# Patient Record
Sex: Male | Born: 1980 | Race: White | Hispanic: No | Marital: Single | Smoking: Never smoker
Health system: Southern US, Community
[De-identification: ages and names within clinical notes are randomized; demographics above are authoritative.]

## PROBLEM LIST (undated history)

## (undated) DIAGNOSIS — K219 Gastro-esophageal reflux disease without esophagitis: Secondary | ICD-10-CM

---

## 1999-03-06 ENCOUNTER — Emergency Department (HOSPITAL_COMMUNITY): Admission: EM | Admit: 1999-03-06 | Discharge: 1999-03-06 | Payer: Self-pay

## 2018-05-29 ENCOUNTER — Other Ambulatory Visit: Payer: Self-pay

## 2018-05-29 ENCOUNTER — Emergency Department (HOSPITAL_COMMUNITY)
Admission: EM | Admit: 2018-05-29 | Discharge: 2018-05-29 | Disposition: A | Discharge status: Home / Self Care | Payer: PRIVATE HEALTH INSURANCE | Attending: Emergency Medicine | Admitting: Emergency Medicine

## 2018-05-29 ENCOUNTER — Encounter (HOSPITAL_COMMUNITY): Payer: Self-pay | Admitting: *Deleted

## 2018-05-29 ENCOUNTER — Emergency Department (HOSPITAL_COMMUNITY): Payer: PRIVATE HEALTH INSURANCE

## 2018-05-29 DIAGNOSIS — W1789XA Other fall from one level to another, initial encounter: Secondary | ICD-10-CM | POA: Diagnosis not present

## 2018-05-29 DIAGNOSIS — R55 Syncope and collapse: Secondary | ICD-10-CM | POA: Insufficient documentation

## 2018-05-29 DIAGNOSIS — S0003XA Contusion of scalp, initial encounter: Secondary | ICD-10-CM | POA: Diagnosis not present

## 2018-05-29 DIAGNOSIS — Y9222 Religious institution as the place of occurrence of the external cause: Secondary | ICD-10-CM | POA: Diagnosis not present

## 2018-05-29 DIAGNOSIS — Y999 Unspecified external cause status: Secondary | ICD-10-CM | POA: Insufficient documentation

## 2018-05-29 DIAGNOSIS — Y9389 Activity, other specified: Secondary | ICD-10-CM | POA: Insufficient documentation

## 2018-05-29 DIAGNOSIS — M546 Pain in thoracic spine: Secondary | ICD-10-CM | POA: Insufficient documentation

## 2018-05-29 DIAGNOSIS — S0990XA Unspecified injury of head, initial encounter: Secondary | ICD-10-CM

## 2018-05-29 DIAGNOSIS — S60511A Abrasion of right hand, initial encounter: Secondary | ICD-10-CM | POA: Diagnosis not present

## 2018-05-29 DIAGNOSIS — T148XXA Other injury of unspecified body region, initial encounter: Secondary | ICD-10-CM

## 2018-05-29 LAB — BASIC METABOLIC PANEL
Anion gap: 10 (ref 5–15)
BUN: 12 mg/dL (ref 6–20)
CHLORIDE: 104 mmol/L (ref 98–111)
CO2: 27 mmol/L (ref 22–32)
Calcium: 9.5 mg/dL (ref 8.9–10.3)
Creatinine, Ser: 1.06 mg/dL (ref 0.61–1.24)
GFR calc Af Amer: >60 mL/min (ref >60)
Glucose, Bld: 101 mg/dL — ABNORMAL HIGH (ref 70–99)
POTASSIUM: 3.9 mmol/L (ref 3.5–5.1)
Sodium: 141 mmol/L (ref 135–145)

## 2018-05-29 LAB — CBC
HEMATOCRIT: 43.4 % (ref 39.0–52.0)
Hemoglobin: 14.5 g/dL (ref 13.0–17.0)
MCH: 29.7 pg (ref 26.0–34.0)
MCHC: 33.4 g/dL (ref 30.0–36.0)
MCV: 88.8 fL (ref 78.0–100.0)
Platelets: 255 10*3/uL (ref 150–400)
RBC: 4.89 MIL/uL (ref 4.22–5.81)
RDW: 12.1 % (ref 11.5–15.5)
WBC: 6.6 10*3/uL (ref 4.0–10.5)

## 2018-05-29 MED ORDER — TETANUS-DIPHTH-ACELL PERTUSSIS 5-2.5-18.5 LF-MCG/0.5 IM SUSP: 0.5000 mL | Freq: Once | INTRAMUSCULAR | Status: DC

## 2018-05-29 NOTE — ED Triage Notes (Signed)
Pt arrived by [facility], was working and standing approx 5 ft high and fell backwards. Ems was unsure whether pt had syncope that caused fall or fall then syncope. Has hematoma to back of head. No other complaints.

## 2018-05-29 NOTE — ED Notes (Signed)
Pt refused tetanus. Will get fromPCP

## 2018-05-30 NOTE — ED Provider Notes (Signed)
[facility] Provider Note   CSN: [id number] Arrival date & time: 05/29/18  1410     History   Chief Complaint Chief Complaint  Patient presents with  . Loss of Consciousness    HPI Darrell CaroliKeith Daniel Guzman is a 37 y.o. male.  HPI  Presents with concern for fall off of the back of a pick up truck. 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  Had brief LOC. Initially was confused, asking repetitive questioning, however has improved now. Mild soreness to back of head. No numbness, no weakness, no facial droop. Denies other injuries, abrasion to hand.   History reviewed. No pertinent past medical history.  There are no active problems to display for this patient.   History reviewed. No pertinent surgical history.      Home Medications    Prior to Admission medications   Not on File    Family History History reviewed. No pertinent family history.  Social History Social History   Tobacco Use  . Smoking status: Not on file  Substance Use Topics  . Alcohol use: Not on file  . Drug use: Not on file     Allergies   Patient has no known allergies.   Review of Systems Review of Systems  Constitutional: Negative for fever.  HENT: Negative for sore throat.   Eyes: Negative for visual disturbance.  Respiratory: Negative for shortness of breath.   Cardiovascular: Negative for chest pain.  Gastrointestinal: Negative for abdominal pain, nausea and vomiting.  Genitourinary: Negative for difficulty urinating.  Musculoskeletal: Negative for back pain and neck stiffness.  Skin: Negative for rash.  Neurological: Positive for headaches. Negative for syncope.  Psychiatric/Behavioral: Positive for confusion.     Physical Exam Updated Vital Signs BP 132/84 (BP Location: Right Arm)   Pulse 97   Temp  98.3 F (36.8 C) (Oral)   Resp 16   SpO2 98%   Physical Exam  Constitutional: He is oriented to person, place, and time. He appears well-developed and well-nourished. No distress.  HENT:  Head: Normocephalic.  Hematoma to posterior scalp, abrasions  Eyes: Conjunctivae and EOM are normal.  Neck: Normal range of motion.  No midline tenderness, has right paraspinal tenderness  Cardiovascular: Normal rate, regular rhythm, normal heart sounds and intact distal pulses. Exam reveals no gallop and no friction rub.  No murmur heard. Pulmonary/Chest: Effort normal and breath sounds normal. No respiratory distress. He has no wheezes. He has no rales.  Abdominal: Soft. He exhibits no distension. There is no tenderness. There is no guarding.  Musculoskeletal: He exhibits no edema.  Abrasion right hand, normal ROM, no tenderness  Neurological: He is alert and oriented to person, place, and time. He has normal strength. No cranial nerve deficit or sensory deficit.  Skin: Skin is warm and dry. He is not diaphoretic.  Nursing note and vitals reviewed.    ED Treatments / Results  Labs (all labs ordered are listed, but only abnormal results are displayed) Labs Reviewed  BASIC METABOLIC PANEL - Abnormal; Notable for the following components:      Result Value   Glucose, Bld 101 (*)    All other components within normal limits  CBC    EKG EKG Interpretation  Date/Time:  Sunday May 29 2018 14:31:19 EDT Ventricular Rate:  111 PR Interval:  152 QRS Duration: 88 QT Interval:  320 QTC Calculation: 435 R Axis:   8 Text Interpretation:  Sinus tachycardia Cannot rule out Anterior infarct , age undetermined Abnormal ECG No prior ECG Confirmed by Alvira Monday ([id number]) on 05/29/2018 7:20:28 PM   Radiology Ct Head Wo Contrast  Result Date: 05/29/2018 CLINICAL DATA:  Fall, loss of consciousness.  Headache. EXAM: CT HEAD WITHOUT CONTRAST TECHNIQUE: Contiguous axial images were obtained from  the base of the skull through the vertex without intravenous contrast. COMPARISON:  None. FINDINGS: Brain: No acute intracranial abnormality. Specifically, no hemorrhage, hydrocephalus, mass lesion, acute infarction, or significant intracranial injury. Vascular: No hyperdense vessel or unexpected calcification. Skull: No acute calvarial abnormality. Sinuses/Orbits: Opacified right maxillary sinus. Mucosal thickening in the right frontal sinus. Mastoid air cells are clear. Orbital soft tissues unremarkable. Other: None IMPRESSION: No acute intracranial abnormality. Chronic sinusitis. Electronically Signed   By: Charlett Nose M.D.   On: 05/29/2018 18:47    Procedures Procedures (including critical care time)  Medications Ordered in ED Medications - No data to display   Initial Impression / Assessment and Plan / ED Course  I have reviewed the triage vital signs and the nursing notes.  Pertinent labs & imaging results that were available during my care of the patient were reviewed by me and considered in my medical decision making (see chart for details).     37yo male presents with concern for fall off of back of truck with head injury. CT without acute findings. Doubt cervical spine injury by NEXUS criteria. No signs of other injury. Possible concussion versus other head injury with abrasion.  Offered TDap but pt declines. Patient discharged in stable condition with understanding of reasons to return.   Final Clinical Impressions(s) / ED Diagnoses   Final diagnoses:  Traumatic injury of head, initial encounter  Abrasion    ED Discharge Orders    None       Alvira Monday, MD 05/30/18 2024

## 2019-11-17 IMAGING — CT CT HEAD W/O CM
4 series · 15 of 47 positions shown, 17 images · non-contrast
Comparison: None.

CLINICAL DATA: Fall, loss of consciousness.  Headache.

EXAM:
CT HEAD WITHOUT CONTRAST
TECHNIQUE: Contiguous axial images were obtained from the base of the skull
through the vertex without intravenous contrast.

[Series 3: head wo · axial · 0.45mm/px · z∈[-133,-8]mm · 7 of 35 slices shown, 9 images]
[im 5/35  brain]
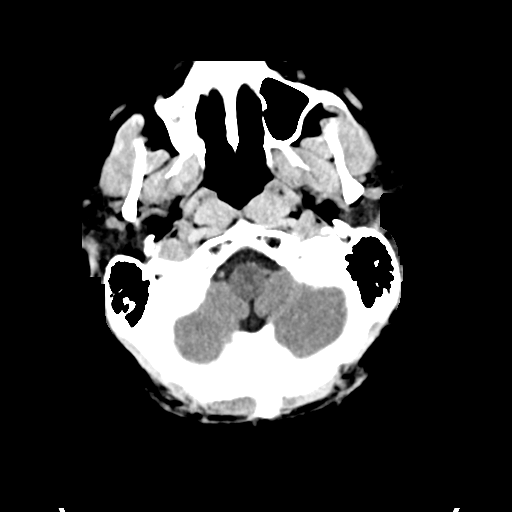
[im 5/35  bone]
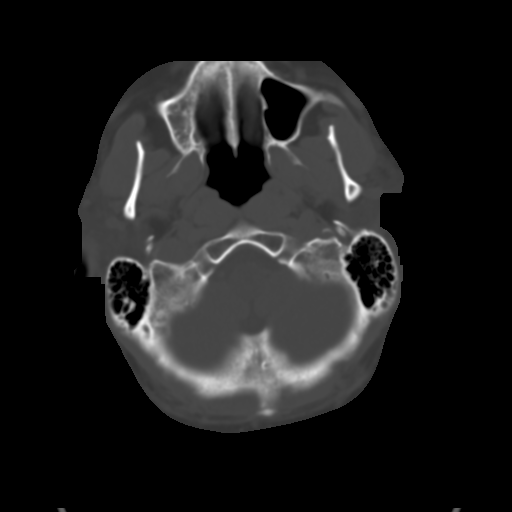
[im 9/35  brain]
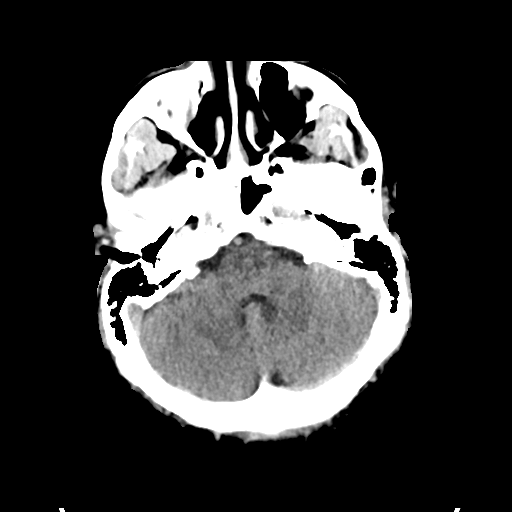
[im 13/35  brain]
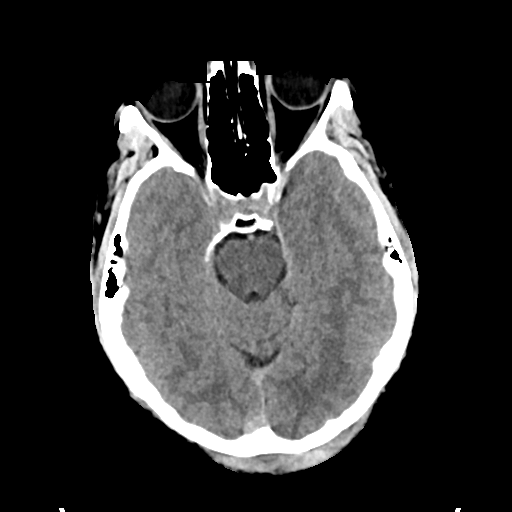
[im 18/35  brain]
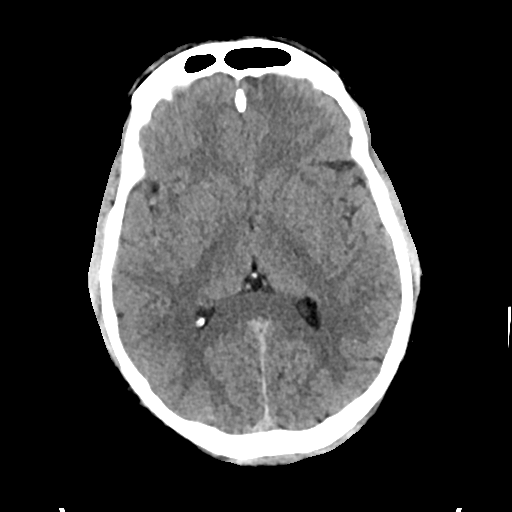
[im 22/35  brain]
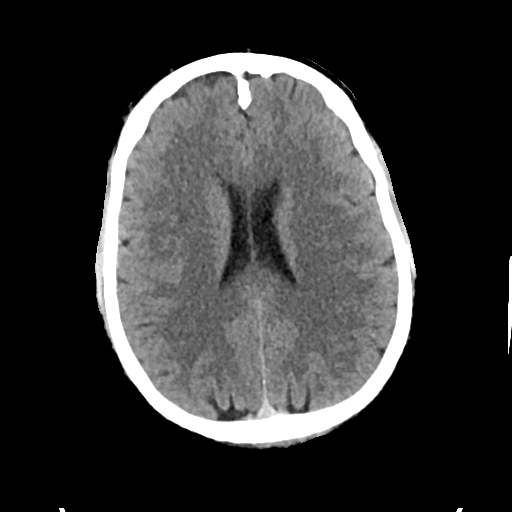
[im 22/35  bone]
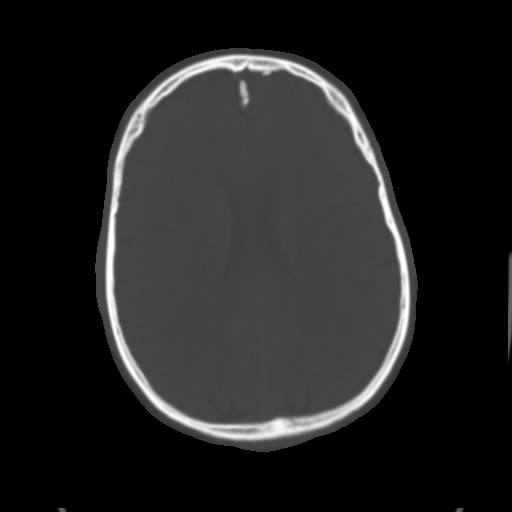
[im 26/35  brain]
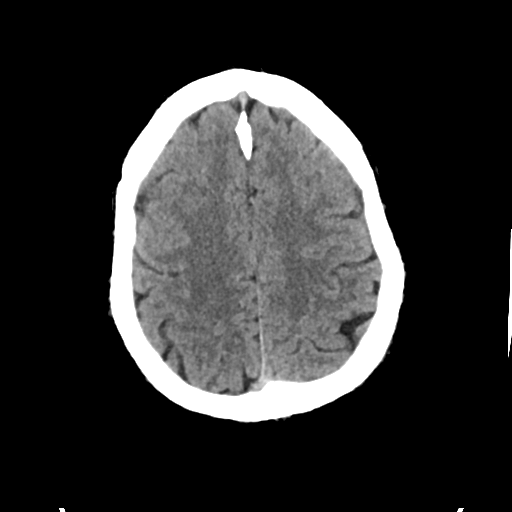
[im 30/35  brain]
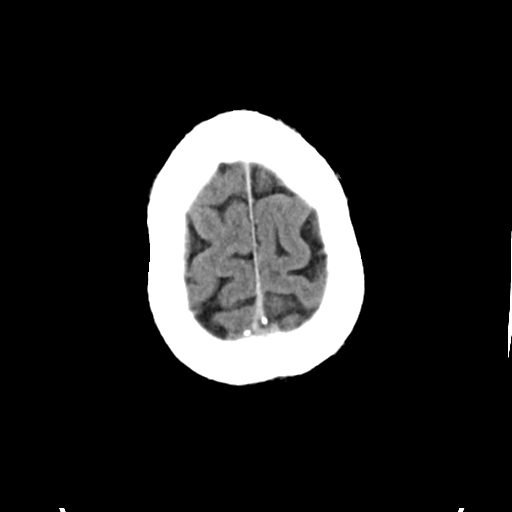

[Series 4: head bone · axial · 0.45mm/px · z∈[-137,-119]mm · 2 of 86 slices shown]
[im 9/86  bone]
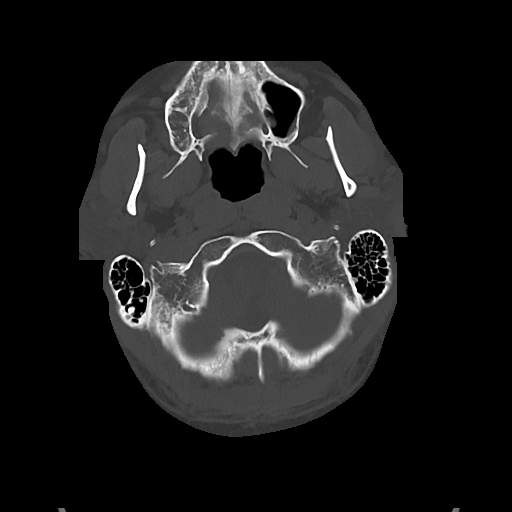
[im 18/86  bone]
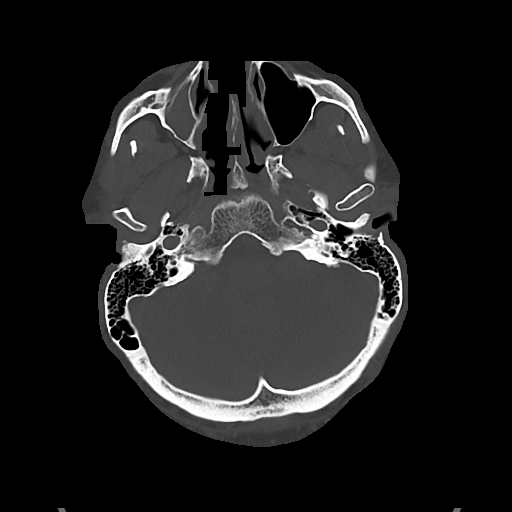

[Series 5: cor soft · coronal · 0.36mm/px · 3 of 69 slices shown]
[im 23/69  brain]
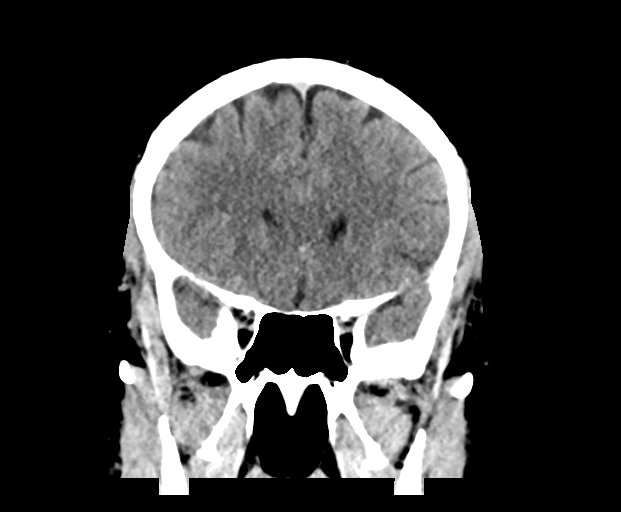
[im 31/69  brain]
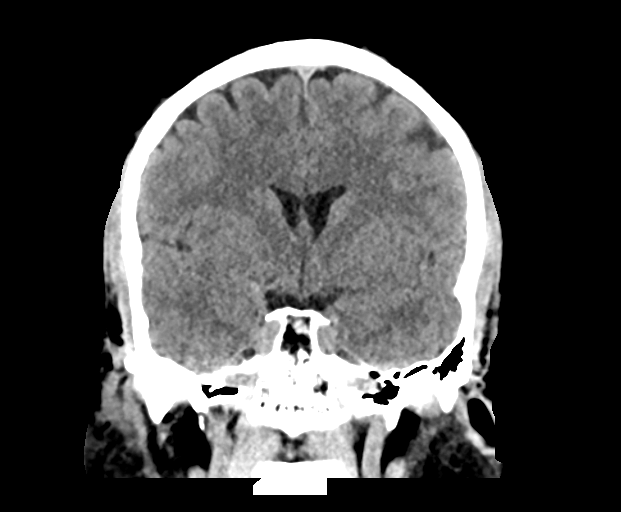
[im 38/69  brain]
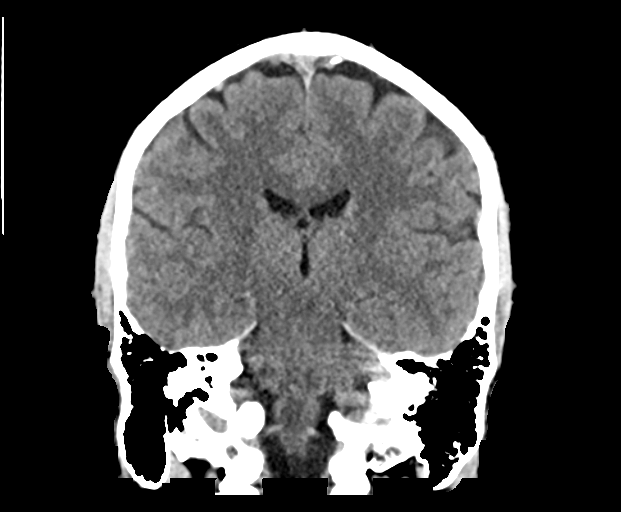

[Series 6: sag soft · sagittal · 0.33mm/px · 3 of 55 slices shown]
[im 19/55  brain]
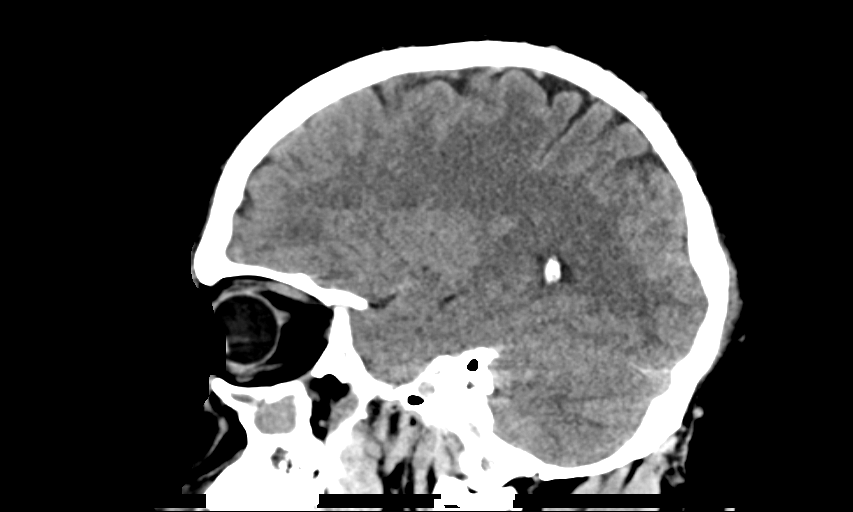
[im 28/55  brain]
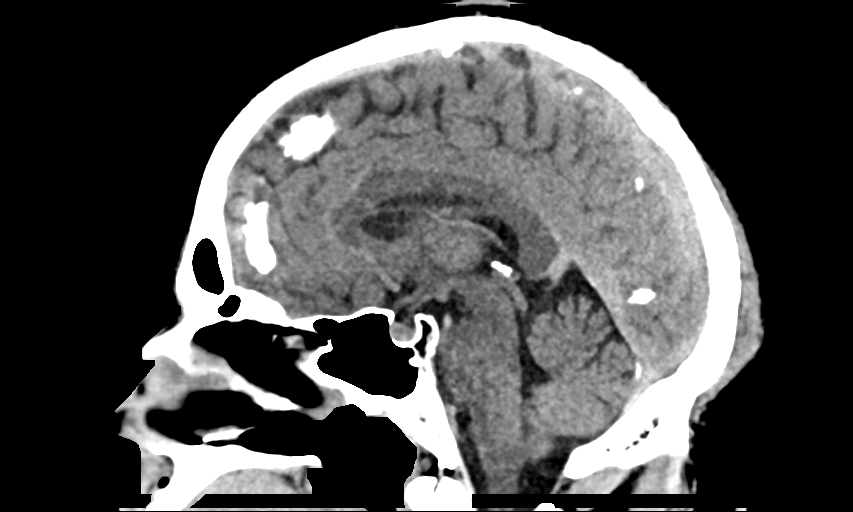
[im 37/55  brain]
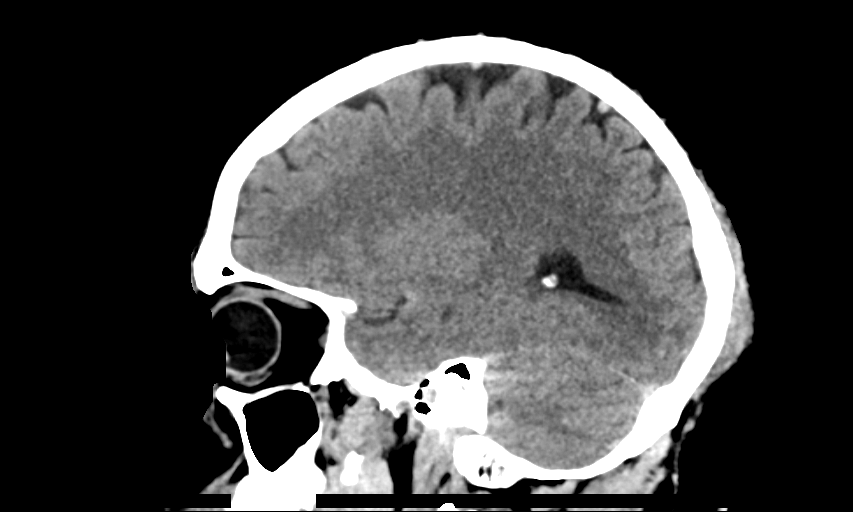

[15 of 47 positions shown; findings below may reference images not displayed]

FINDINGS: Brain: No acute intracranial abnormality. Specifically, no
hemorrhage, hydrocephalus, mass lesion, acute infarction, or
significant intracranial injury.

Vascular: No hyperdense vessel or unexpected calcification.

Skull: No acute calvarial abnormality.

Sinuses/Orbits: Opacified right maxillary sinus. Mucosal thickening
in the right frontal sinus. Mastoid air cells are clear. Orbital
soft tissues unremarkable.

Other: None
IMPRESSION: No acute intracranial abnormality.

Chronic sinusitis.

## 2020-06-11 ENCOUNTER — Ambulatory Visit
Admission: EM | Admit: 2020-06-11 | Discharge: 2020-06-11 | Disposition: A | Discharge status: Home / Self Care | Payer: BC Managed Care – PPO | Attending: Emergency Medicine | Admitting: Emergency Medicine

## 2020-06-11 ENCOUNTER — Encounter: Payer: Self-pay | Admitting: Emergency Medicine

## 2020-06-11 DIAGNOSIS — J029 Acute pharyngitis, unspecified: Secondary | ICD-10-CM

## 2020-06-11 DIAGNOSIS — Z1152 Encounter for screening for COVID-19: Secondary | ICD-10-CM

## 2020-06-11 DIAGNOSIS — R062 Wheezing: Secondary | ICD-10-CM | POA: Diagnosis not present

## 2020-06-11 DIAGNOSIS — R059 Cough, unspecified: Secondary | ICD-10-CM | POA: Diagnosis not present

## 2020-06-11 DIAGNOSIS — J069 Acute upper respiratory infection, unspecified: Secondary | ICD-10-CM

## 2020-06-11 HISTORY — DX: Gastro-esophageal reflux disease without esophagitis: K21.9

## 2020-06-11 LAB — POCT RAPID STREP A (OFFICE): Rapid Strep A Screen: NEGATIVE

## 2020-06-11 MED ORDER — FLUTICASONE PROPIONATE 50 MCG/ACT NA SUSP: 1.0000 | Freq: Every day | NASAL | 0 refills | Status: AC

## 2020-06-11 MED ORDER — BENZONATATE 100 MG PO CAPS: 100.0000 mg | ORAL_CAPSULE | Freq: Three times a day (TID) | ORAL | 0 refills | Status: DC

## 2020-06-11 MED ORDER — CETIRIZINE HCL 10 MG PO TABS: 10.0000 mg | ORAL_TABLET | Freq: Every day | ORAL | 0 refills | Status: AC

## 2020-06-11 MED ORDER — IBUPROFEN 800 MG PO TABS
800.0000 mg | ORAL_TABLET | Freq: Once | ORAL | Status: AC
  Administered 2020-06-11: 800 mg via ORAL

## 2020-06-11 MED ORDER — PREDNISONE 10 MG PO TABS: 20.0000 mg | ORAL_TABLET | Freq: Every day | ORAL | 0 refills | Status: DC

## 2020-06-11 MED ORDER — LIDOCAINE VISCOUS HCL 2 % MT SOLN: 15.0000 mL | OROMUCOSAL | 0 refills | Status: AC | PRN

## 2020-06-11 NOTE — ED Triage Notes (Signed)
Sore throat and cough since yesterday. Had neg rapid covid done at [facility] today

## 2020-06-11 NOTE — Discharge Instructions (Addendum)
Strep test was negative. Sample will be sent for culture and someone will call if your result is abnormal  COVID testing ordered.  It will take between 2-7 days for test results.  Someone will contact you regarding abnormal results.    In the meantime: You should remain isolated in your home for 10 days from symptom onset AND greater than 24 hours after symptoms resolution (absence of fever without the use of fever-reducing medication and improvement in respiratory symptoms), whichever is longer Get plenty of rest and push fluids Tessalon Perles prescribed for cough Zyrtec-D prescribed for nasal congestion, runny nose, and/or sore throat Flonase prescribed for nasal congestion and runny nose Prednisone as prescribed for wheezing Lidocaine mouthwash for sore throat Use medications daily for symptom relief Use OTC medications like ibuprofen or tylenol as needed fever or pain Call or go to the ED if you have any new or worsening symptoms such as fever, worsening cough, shortness of breath, chest tightness, chest pain, turning blue, changes in mental status, etc

## 2020-06-11 NOTE — ED Provider Notes (Addendum)
[facility]   [id number] 06/11/20 Arrival Time: 1753   Chief Complaint  Patient presents with  . Sore Throat     SUBJECTIVE: History from: patient.  Darrell Guzman is a 39 y.o. male who presents with complaint of sore throat, cough and congestion that started yesterday.  Denies sick exposure to COVID, flu or strep.  Denies recent travel.  Has tried OTC medication without relief.  Denies aggravating factors.  Denies previous symptoms in the past.   Denies  chills, fatigue, sinus pain, rhinorrhea, sore throat, SOB, wheezing, chest pain, nausea, changes in bowel or bladder habits.    ROS: As per HPI.  All other pertinent ROS negative.     Past Medical History:  Diagnosis Date  . GERD (gastroesophageal reflux disease)    History reviewed. No pertinent surgical history. No Known Allergies No current facility-administered medications on file prior to encounter.   No current outpatient medications on file prior to encounter.   Social History   Socioeconomic History  . Marital status: Single    Spouse name: Not on file  . Number of children: Not on file  . Years of education: Not on file  . Highest education level: Not on file  Occupational History  . Not on file  Tobacco Use  . Smoking status: Never Smoker  . Smokeless tobacco: Never Used  Substance and Sexual Activity  . Alcohol use: Never  . Drug use: Never  . Sexual activity: Not on file  Other Topics Concern  . Not on file  Social History Narrative  . Not on file   Social Determinants of Health   Financial Resource Strain:   . Difficulty of Paying Living Expenses: Not on file  Food Insecurity:   . Worried About Programme researcher, broadcasting/film/video in the Last Year: Not on file  . Ran Out of Food in the Last Year: Not on file  Transportation Needs:   . Lack of Transportation (Medical): Not on file  . Lack of Transportation (Non-Medical): Not on file  Physical Activity:   . Days of Exercise per Week: Not on  file  . Minutes of Exercise per Session: Not on file  Stress:   . Feeling of Stress : Not on file  Social Connections:   . Frequency of Communication with Friends and Family: Not on file  . Frequency of Social Gatherings with Friends and Family: Not on file  . Attends Religious Services: Not on file  . Active Member of Clubs or Organizations: Not on file  . Attends Banker Meetings: Not on file  . Marital Status: Not on file  Intimate Partner Violence:   . Fear of Current or Ex-Partner: Not on file  . Emotionally Abused: Not on file  . Physically Abused: Not on file  . Sexually Abused: Not on file   No family history on file.  OBJECTIVE:  Vitals:   06/11/20 1821 06/11/20 1822  BP: 121/82   Pulse: (!) 123   Resp: 18   Temp: (!) 100.5 F (38.1 C)   TempSrc: Oral   SpO2: 92%   Weight:  220 lb (99.8 kg)  Height:  5\' 8"  (1.727 m)     General appearance: alert; appears fatigued, but nontoxic; speaking in full sentences and tolerating own secretions HEENT: NCAT; Ears: EACs clear, TMs pearly gray; Eyes: PERRL.  EOM grossly intact. Sinuses: nontender; Nose: nares patent without rhinorrhea, Throat: oropharynx clear, tonsils non erythematous or enlarged, uvula midline  Neck:  supple without LAD Lungs: unlabored respirations, symmetrical air entry; cough: mild; no respiratory distress; bilateral wheezing Heart: regular rate and rhythm.  Radial pulses 2+ symmetrical bilaterally Skin: warm and dry Psychological: alert and cooperative; normal mood and affect  LABS:  Results for orders placed or performed during the hospital encounter of 06/11/20 (from the past 24 hour(s))  POCT rapid strep A     Status: None   Collection Time: 06/11/20  6:27 PM  Result Value Ref Range   Rapid Strep A Screen Negative Negative     ASSESSMENT & PLAN:  1. URI with cough and congestion   2. Wheezing   3. Encounter for screening for COVID-19   4. Sore throat     Meds ordered this  encounter  Medications  . ibuprofen (ADVIL) tablet 800 mg  . fluticasone (FLONASE) 50 MCG/ACT nasal spray    Sig: Place 1 spray into both nostrils daily for 14 days.    Dispense:  16 g    Refill:  0  . cetirizine (ZYRTEC ALLERGY) 10 MG tablet    Sig: Take 1 tablet (10 mg total) by mouth daily.    Dispense:  30 tablet    Refill:  0  . benzonatate (TESSALON) 100 MG capsule    Sig: Take 1 capsule (100 mg total) by mouth every 8 (eight) hours.    Dispense:  30 capsule    Refill:  0  . predniSONE (DELTASONE) 10 MG tablet    Sig: Take 2 tablets (20 mg total) by mouth daily.    Dispense:  15 tablet    Refill:  0  . lidocaine (XYLOCAINE) 2 % solution    Sig: Use as directed 15 mLs in the mouth or throat as needed for mouth pain.    Dispense:  100 mL    Refill:  0    Discharge instructions  Strep test was negative. Sample will be sent for culture and someone will call if your result is abnormal  COVID testing ordered.  It will take between 2-7 days for test results.  Someone will contact you regarding abnormal results.    In the meantime: You should remain isolated in your home for 10 days from symptom onset AND greater than 24 hours after symptoms resolution (absence of fever without the use of fever-reducing medication and improvement in respiratory symptoms), whichever is longer Get plenty of rest and push fluids Tessalon Perles prescribed for cough Zyrtec-D prescribed for nasal congestion, runny nose, and/or sore throat Flonase prescribed for nasal congestion and runny nose Prednisone as prescribed for wheezing Lidocaine mouthwash for sore throat Use medications daily for symptom relief Use OTC medications like ibuprofen or tylenol as needed fever or pain Call or go to the ED if you have any new or worsening symptoms such as fever, worsening cough, shortness of breath, chest tightness, chest pain, turning blue, changes in mental status, etc...  Reviewed expectations re: course  of current medical issues. Questions answered. Outlined signs and symptoms indicating need for more acute intervention. Patient verbalized understanding. After Visit Summary given.         Durward Parcel, FNP 06/11/20 1849    Durward Parcel, FNP 06/11/20 1849

## 2020-06-13 LAB — SARS-COV-2, NAA 2 DAY TAT

## 2020-06-13 LAB — NOVEL CORONAVIRUS, NAA: SARS-CoV-2, NAA: NOT DETECTED

## 2020-06-14 LAB — CULTURE, GROUP A STREP (THRC): Special Requests: NORMAL

## 2020-11-04 ENCOUNTER — Other Ambulatory Visit: Payer: Self-pay

## 2020-11-04 ENCOUNTER — Ambulatory Visit
Admission: EM | Admit: 2020-11-04 | Discharge: 2020-11-04 | Disposition: A | Discharge status: Home / Self Care | Payer: BC Managed Care – PPO | Attending: Family Medicine | Admitting: Family Medicine

## 2020-11-04 ENCOUNTER — Encounter: Payer: Self-pay | Admitting: Emergency Medicine

## 2020-11-04 ENCOUNTER — Ambulatory Visit (INDEPENDENT_AMBULATORY_CARE_PROVIDER_SITE_OTHER): Payer: BC Managed Care – PPO

## 2020-11-04 DIAGNOSIS — R0602 Shortness of breath: Secondary | ICD-10-CM

## 2020-11-04 DIAGNOSIS — J209 Acute bronchitis, unspecified: Secondary | ICD-10-CM

## 2020-11-04 DIAGNOSIS — R059 Cough, unspecified: Secondary | ICD-10-CM

## 2020-11-04 MED ORDER — PREDNISONE 20 MG PO TABS: 40.0000 mg | ORAL_TABLET | Freq: Every day | ORAL | 0 refills | Status: AC

## 2020-11-04 MED ORDER — DOXYCYCLINE HYCLATE 100 MG PO CAPS: 100.0000 mg | ORAL_CAPSULE | Freq: Two times a day (BID) | ORAL | 0 refills | Status: AC

## 2020-11-04 MED ORDER — BENZONATATE 200 MG PO CAPS: 200.0000 mg | ORAL_CAPSULE | Freq: Three times a day (TID) | ORAL | 0 refills | Status: AC

## 2020-11-04 NOTE — ED Provider Notes (Signed)
[facility]    CSN: [id number] Arrival date & time: 11/04/20  0908      History   Chief Complaint No chief complaint on file.   HPI Darrell Guzman is a 40 y.o. male.   HPI  Patient presents today for COVID/Flu testing after developing acute onset fever, and persistently worsening cough x1 day.  Upon awakening today patient endorses shortness of breath with slight activity.  Cough is nonproductive.  He has no history of COPD or asthma.  He denies any known sick contacts.  He has not taken any medications today for symptoms.  He did take a home COVID test last night which was negative.  Past Medical History:  Diagnosis Date  . GERD (gastroesophageal reflux disease)     There are no problems to display for this patient.   History reviewed. No pertinent surgical history.     Home Medications    Prior to Admission medications   Medication Sig Start Date End Date Taking? Authorizing Provider  doxycycline (VIBRAMYCIN) 100 MG capsule Take 1 capsule (100 mg total) by mouth 2 (two) times daily for 7 days. 11/04/20 11/11/20 Yes Bing Neighbors, FNP  predniSONE (DELTASONE) 20 MG tablet Take 2 tablets (40 mg total) by mouth daily with breakfast. 11/04/20  Yes Bing Neighbors, FNP  benzonatate (TESSALON) 200 MG capsule Take 1 capsule (200 mg total) by mouth every 8 (eight) hours. 11/04/20   Bing Neighbors, FNP  cetirizine (ZYRTEC ALLERGY) 10 MG tablet Take 1 tablet (10 mg total) by mouth daily. 06/11/20   Avegno, Zachery Dakins, FNP  fluticasone (FLONASE) 50 MCG/ACT nasal spray Place 1 spray into both nostrils daily for 14 days. 06/11/20 06/25/20  Avegno, Zachery Dakins, FNP  lidocaine (XYLOCAINE) 2 % solution Use as directed 15 mLs in the mouth or throat as needed for mouth pain. 06/11/20   Avegno, Zachery Dakins, FNP    Family History History reviewed. No pertinent family history.  Social History Social History   Tobacco Use  . Smoking status: Never Smoker  .  Smokeless tobacco: Never Used  Substance Use Topics  . Alcohol use: Never  . Drug use: Never     Allergies   Patient has no known allergies.   Review of Systems Review of Systems Pertinent negatives listed in HPI  Physical Exam Triage Vital Signs ED Triage Vitals  Enc Vitals Group     BP 11/04/20 0941 114/79     Pulse Rate 11/04/20 0941 (!) 102     Resp 11/04/20 0941 17     Temp 11/04/20 0941 99.5 F (37.5 C)     Temp Source 11/04/20 0941 Oral     SpO2 11/04/20 0941 94 %     Weight --      Height --      Head Circumference --      Peak Flow --      Pain Score 11/04/20 0944 0     Pain Loc --      Pain Edu? --      Excl. in GC? --    No data found.  Updated Vital Signs BP 114/79 (BP Location: Right Arm)   Pulse (!) 102   Temp 99.5 F (37.5 C) (Oral)   Resp 17   SpO2 94%   Visual Acuity Right Eye Distance:   Left Eye Distance:   Bilateral Distance:    Right Eye Near:   Left Eye Near:    Bilateral Near:  Physical Exam Constitutional:      Appearance: He is ill-appearing.  HENT:     Head: Normocephalic and atraumatic.     Nose: Nose normal.  Eyes:     Extraocular Movements: Extraocular movements intact.     Pupils: Pupils are equal, round, and reactive to light.  Cardiovascular:     Rate and Rhythm: Regular rhythm. Tachycardia present.  Pulmonary:     Effort: Pulmonary effort is normal.     Breath sounds: Wheezing and rhonchi present.     Comments: Expiratory wheeze Abdominal:     General: Abdomen is flat.  Musculoskeletal:        General: Normal range of motion.     Cervical back: Normal range of motion.  Skin:    General: Skin is warm.     Capillary Refill: Capillary refill takes less than 2 seconds.  Neurological:     General: No focal deficit present.     Mental Status: He is alert and oriented to person, place, and time.     Motor: No weakness.     Gait: Gait normal.  Psychiatric:        Mood and Affect: Mood normal.         Behavior: Behavior normal.        Thought Content: Thought content normal.        Judgment: Judgment normal.      UC Treatments / Results  Labs (all labs ordered are listed, but only abnormal results are displayed) Labs Reviewed  COVID-19, FLU A+B NAA    EKG   Radiology DG Chest 2 View  Result Date: 11/04/2020 CLINICAL DATA:  Shortness of breath. EXAM: CHEST - 2 VIEW COMPARISON:  02/02/2020 FINDINGS: Lungs are hyperexpanded. Interstitial markings are diffusely coarsened with chronic features. The lungs are clear without focal pneumonia, edema, pneumothorax or pleural effusion. The cardiopericardial silhouette is within normal limits for size. The visualized bony structures of the thorax show no acute abnormality. IMPRESSION: Hyperexpansion without acute cardiopulmonary findings. Electronically Signed   By: Kennith Center M.D.   On: 11/04/2020 10:18    Procedures Procedures (including critical care time)  Medications Ordered in UC Medications - No data to display  Initial Impression / Assessment and Plan / UC Course  I have reviewed the triage vital signs and the nursing notes.  Pertinent labs & imaging results that were available during my care of the patient were reviewed by me and considered in my medical decision making (see chart for details).    Covid/flu pending.  Chest x-ray showed hyperinflation of lungs with some interstitial markings given my concern for COVID-19 we will treat with coverage for possible developing pneumonia and will treat bronchitis with a course of prednisone given lower than normal oxygen level.  Doxycycline 100 mg twice daily x7 days.  Prednisone 40 mg for 5 days.  Benzonatate Perles 200 mg 3 times daily as needed for cough.  Patient given strict precautions that if any of his respiratory symptoms such as breathing or wheezing worsen while on current treatment immediately to the emergency department.  Covid test will result in 3 to 5 days.  In the  meantime remain at home given the fact that he has a fever and abrupt onset of symptoms concerning for possible COVID or flu as underlying etiology of current symptoms. Final Clinical Impressions(s) / UC Diagnoses   Final diagnoses:  Cough  Acute bronchitis, unspecified organism     Discharge Instructions  Your COVID 19 results should result within 3-5 days. Negative results are immediately resulted to Mychart. Positive results will receive a follow-up call from our clinic. If symptoms are present, I recommend home quarantine until results are known.  Alternate Tylenol and ibuprofen as needed for body aches and fever.   Take medication prescribed for management of cough.   Start prednisone take 2 tablets with food daily for the next 5 days this will improve bronchial inflammation which is causing cough  I am also covering with doxycycline twice daily for 7 days for acute bronchitis with possible early signs of any pneumonia indicating no chest x-ray. . Symptom management per recommendations discussed today.  If any breathing difficulty or chest pain develops go immediately to the closest emergency department for evaluation.    ED Prescriptions    Medication Sig Dispense Auth. Provider   benzonatate (TESSALON) 200 MG capsule Take 1 capsule (200 mg total) by mouth every 8 (eight) hours. 40 capsule Bing Neighbors, FNP   doxycycline (VIBRAMYCIN) 100 MG capsule Take 1 capsule (100 mg total) by mouth 2 (two) times daily for 7 days. 14 capsule Bing Neighbors, FNP   predniSONE (DELTASONE) 20 MG tablet Take 2 tablets (40 mg total) by mouth daily with breakfast. 10 tablet Bing Neighbors, FNP     PDMP not reviewed this encounter.   Bing Neighbors, FNP 11/04/20 1731

## 2020-11-04 NOTE — Discharge Instructions (Addendum)
Your COVID 19 results should result within 3-5 days. Negative results are immediately resulted to Mychart. Positive results will receive a follow-up call from our clinic. If symptoms are present, I recommend home quarantine until results are known.  Alternate Tylenol and ibuprofen as needed for body aches and fever.   Take medication prescribed for management of cough.   Start prednisone take 2 tablets with food daily for the next 5 days this will improve bronchial inflammation which is causing cough  I am also covering with doxycycline twice daily for 7 days for acute bronchitis with possible early signs of any pneumonia indicating no chest x-ray. . Symptom management per recommendations discussed today.  If any breathing difficulty or chest pain develops go immediately to the closest emergency department for evaluation.

## 2020-11-04 NOTE — ED Triage Notes (Signed)
Fever and cough since last night.  Neg home covid

## 2020-11-05 LAB — COVID-19, FLU A+B NAA
Influenza A, NAA: NOT DETECTED
Influenza B, NAA: NOT DETECTED
SARS-CoV-2, NAA: NOT DETECTED

## 2021-12-18 ENCOUNTER — Ambulatory Visit (INDEPENDENT_AMBULATORY_CARE_PROVIDER_SITE_OTHER): Payer: BC Managed Care – PPO

## 2021-12-18 ENCOUNTER — Other Ambulatory Visit: Payer: Self-pay

## 2021-12-18 ENCOUNTER — Encounter: Payer: Self-pay | Admitting: Emergency Medicine

## 2021-12-18 ENCOUNTER — Ambulatory Visit
Admission: EM | Admit: 2021-12-18 | Discharge: 2021-12-18 | Disposition: A | Discharge status: Home / Self Care | Payer: BC Managed Care – PPO | Attending: Family Medicine | Admitting: Family Medicine

## 2021-12-18 DIAGNOSIS — J189 Pneumonia, unspecified organism: Secondary | ICD-10-CM

## 2021-12-18 DIAGNOSIS — R059 Cough, unspecified: Secondary | ICD-10-CM | POA: Diagnosis not present

## 2021-12-18 DIAGNOSIS — R000 Tachycardia, unspecified: Secondary | ICD-10-CM

## 2021-12-18 DIAGNOSIS — R5383 Other fatigue: Secondary | ICD-10-CM

## 2021-12-18 DIAGNOSIS — R509 Fever, unspecified: Secondary | ICD-10-CM

## 2021-12-18 MED ORDER — DOXYCYCLINE HYCLATE 100 MG PO CAPS: 100.0000 mg | ORAL_CAPSULE | Freq: Two times a day (BID) | ORAL | 0 refills | Status: AC

## 2021-12-18 NOTE — ED Triage Notes (Addendum)
Pt reports fever,chills,cough, fatigue since Friday. Pt also reports was bending over and fixing a printer on Friday and reports left sided flank/rib pain. Pt denies any trouble voiding, other gu or gi symptoms. ? ?Would like to wait for provider assessment to see if covid/flu swab is indicated. ?

## 2021-12-18 NOTE — ED Provider Notes (Signed)
?[facility] ? ? ? ?CSN: [id number] ?Arrival date & time: 12/18/21  1527 ? ? ?  ? ?History   ?Chief Complaint ?Chief Complaint  ?Patient presents with  ? Fever  ? ? ?HPI ?Darrell Guzman is a 41 y.o. male.  ? ?Presenting today with about a week of fever, chills, cough, fatigue, left flank pain that he initially felt after bending down to plug in a printer and thought he pulled a muscle.  Denies chest pain, abdominal pain, nausea vomiting diarrhea, sore throat, nasal congestion.  States he tends to have trouble breathing during allergy season anyways and chalked his breathing issues up to that, has been using his albuterol inhaler and Zyrtec, Flonase regimen with minimal relief.  Otherwise not trying anything over-the-counter for symptoms. ? ? ?Past Medical History:  ?Diagnosis Date  ? GERD (gastroesophageal reflux disease)   ? ? ?There are no problems to display for this patient. ? ? ?History reviewed. No pertinent surgical history. ? ? ? ? ?Home Medications   ? ?Prior to Admission medications   ?Medication Sig Start Date End Date Taking? Authorizing Provider  ?doxycycline (VIBRAMYCIN) 100 MG capsule Take 1 capsule (100 mg total) by mouth 2 (two) times daily. 12/18/21  Yes Particia Nearing, PA-C  ?benzonatate (TESSALON) 200 MG capsule Take 1 capsule (200 mg total) by mouth every 8 (eight) hours. 11/04/20   Bing Neighbors, FNP  ?cetirizine (ZYRTEC ALLERGY) 10 MG tablet Take 1 tablet (10 mg total) by mouth daily. 06/11/20   Durward Parcel, FNP  ?fluticasone (FLONASE) 50 MCG/ACT nasal spray Place 1 spray into both nostrils daily for 14 days. 06/11/20 06/25/20  Durward Parcel, FNP  ?lidocaine (XYLOCAINE) 2 % solution Use as directed 15 mLs in the mouth or throat as needed for mouth pain. 06/11/20   Durward Parcel, FNP  ?predniSONE (DELTASONE) 20 MG tablet Take 2 tablets (40 mg total) by mouth daily with breakfast. 11/04/20   Bing Neighbors, FNP  ? ? ?Family History ?History reviewed.  No pertinent family history. ? ?Social History ?Social History  ? ?Tobacco Use  ? Smoking status: Never  ? Smokeless tobacco: Never  ?Substance Use Topics  ? Alcohol use: Never  ? Drug use: Never  ? ? ? ?Allergies   ?Patient has no known allergies. ? ? ?Review of Systems ?Review of Systems ?Per HPI ? ?Physical Exam ?Triage Vital Signs ?ED Triage Vitals  ?Enc Vitals Group  ?   BP 12/18/21 1534 129/84  ?   Pulse Rate 12/18/21 1534 (!) 123  ?   Resp 12/18/21 1534 18  ?   Temp 12/18/21 1534 99.1 ?F (37.3 ?C)  ?   Temp Source 12/18/21 1534 Oral  ?   SpO2 12/18/21 1534 92 %  ?   Weight 12/18/21 1536 210 lb (95.3 kg)  ?   Height 12/18/21 1536 5\' 8"  (1.727 m)  ?   Head Circumference --   ?   Peak Flow --   ?   Pain Score --   ?   Pain Loc --   ?   Pain Edu? --   ?   Excl. in GC? --   ? ?No data found. ? ?Updated Vital Signs ?BP 129/84 (BP Location: Right Arm)   Pulse (!) 112   Temp 99.1 ?F (37.3 ?C) (Oral)   Resp 18   Ht 5\' 8"  (1.727 m)   Wt 210 lb (95.3 kg)   SpO2 92%  BMI 31.93 kg/m?  ? ?Visual Acuity ?Right Eye Distance:   ?Left Eye Distance:   ?Bilateral Distance:   ? ?Right Eye Near:   ?Left Eye Near:    ?Bilateral Near:    ? ?Physical Exam ?Vitals and nursing note reviewed.  ?Constitutional:   ?   Appearance: Normal appearance.  ?HENT:  ?   Head: Atraumatic.  ?   Nose: Nose normal.  ?   Mouth/Throat:  ?   Mouth: Mucous membranes are moist.  ?   Pharynx: Oropharynx is clear. Posterior oropharyngeal erythema present.  ?Eyes:  ?   Extraocular Movements: Extraocular movements intact.  ?   Conjunctiva/sclera: Conjunctivae normal.  ?Cardiovascular:  ?   Rate and Rhythm: Normal rate and regular rhythm.  ?Pulmonary:  ?   Effort: Pulmonary effort is normal.  ?   Breath sounds: Rales present. No wheezing.  ?   Comments: Minimal rales left lower lung ?Musculoskeletal:     ?   General: Normal range of motion.  ?   Cervical back: Normal range of motion and neck supple.  ?Skin: ?   General: Skin is warm and dry.   ?Neurological:  ?   General: No focal deficit present.  ?   Mental Status: He is oriented to person, place, and time.  ?Psychiatric:     ?   Mood and Affect: Mood normal.     ?   Thought Content: Thought content normal.     ?   Judgment: Judgment normal.  ? ? ? ?UC Treatments / Results  ?Labs ?(all labs ordered are listed, but only abnormal results are displayed) ?Labs Reviewed - No data to display ? ?EKG ? ? ?Radiology ?DG Chest 2 View ? ?Result Date: 12/18/2021 ?CLINICAL DATA:  Table formatting from the original note was not included. Pt reports fever,chills,cough, fatigue since Friday. Pt also reports was bending over and fixing a printer on Friday and reports left sided flank/rib pain. EXAM: CHEST - 2 VIEW COMPARISON:  11/04/2020 FINDINGS: Focal poorly marginated airspace consolidation in the superior segment left lower lobe, new since previous. Right lung clear. Heart size and mediastinal contours are within normal limits. No effusion.  No pneumothorax. Visualized bones unremarkable. IMPRESSION: 1. Superior segment left lower lobe airspace disease suggesting pneumonia. Followup PA and lateral chest X-ray is recommended in 3-4 weeks following trial of antibiotic therapy to ensure resolution and exclude underlying malignancy. Electronically Signed   By: Corlis Leak  Hassell M.D.   On: 12/18/2021 16:24   ? ?Procedures ?Procedures (including critical care time) ? ?Medications Ordered in UC ?Medications - No data to display ? ?Initial Impression / Assessment and Plan / UC Course  ?I have reviewed the triage vital signs and the nursing notes. ? ?Pertinent labs & imaging results that were available during my care of the patient were reviewed by me and considered in my medical decision making (see chart for details). ? ?  ? ?Mildly tachycardic in triage with oxygen saturation oscillating between 91 to 92% on room air.  He is breathing comfortably with no respiratory distress.  Chest x-ray revealing a left lower lobe pneumonia.   We will start doxycycline in addition to Mucinex, albuterol inhaler, allergy regimen and other supportive care.  Return for acutely worsening symptoms. ? ?Final Clinical Impressions(s) / UC Diagnoses  ? ?Final diagnoses:  ?Pneumonia of left lower lobe due to infectious organism  ?Fever, unspecified  ?Tachycardia  ? ? ? ?Discharge Instructions   ? ?  ?Follow-up with  primary care in the next 4 to 6 weeks to recheck a chest x-ray and make sure the pneumonia has cleared up.  Go to the emergency department if you are worsening at any time. ? ? ? ?ED Prescriptions   ? ? Medication Sig Dispense Auth. Provider  ? doxycycline (VIBRAMYCIN) 100 MG capsule Take 1 capsule (100 mg total) by mouth 2 (two) times daily. 20 capsule Particia Nearing, [place]  ? ?  ? ?PDMP not reviewed this encounter. ?  ?Particia Nearing, PA-C ?12/18/21 1645 ? ?

## 2021-12-18 NOTE — Discharge Instructions (Signed)
Follow-up with primary care in the next 4 to 6 weeks to recheck a chest x-ray and make sure the pneumonia has cleared up.  Go to the emergency department if you are worsening at any time. ?

## 2023-06-08 IMAGING — DX DG CHEST 2V
2 series · 2 of 2 positions shown · non-contrast
Comparison: 11/04/2020

CLINICAL DATA: Table formatting from the original note was not
included. Pt reports fever,chills,cough, fatigue since [REDACTED]. Pt
also reports was bending over and fixing a printer on [REDACTED] and
reports left sided flank/rib pain.

EXAM:
CHEST - 2 VIEW

[chest pa]
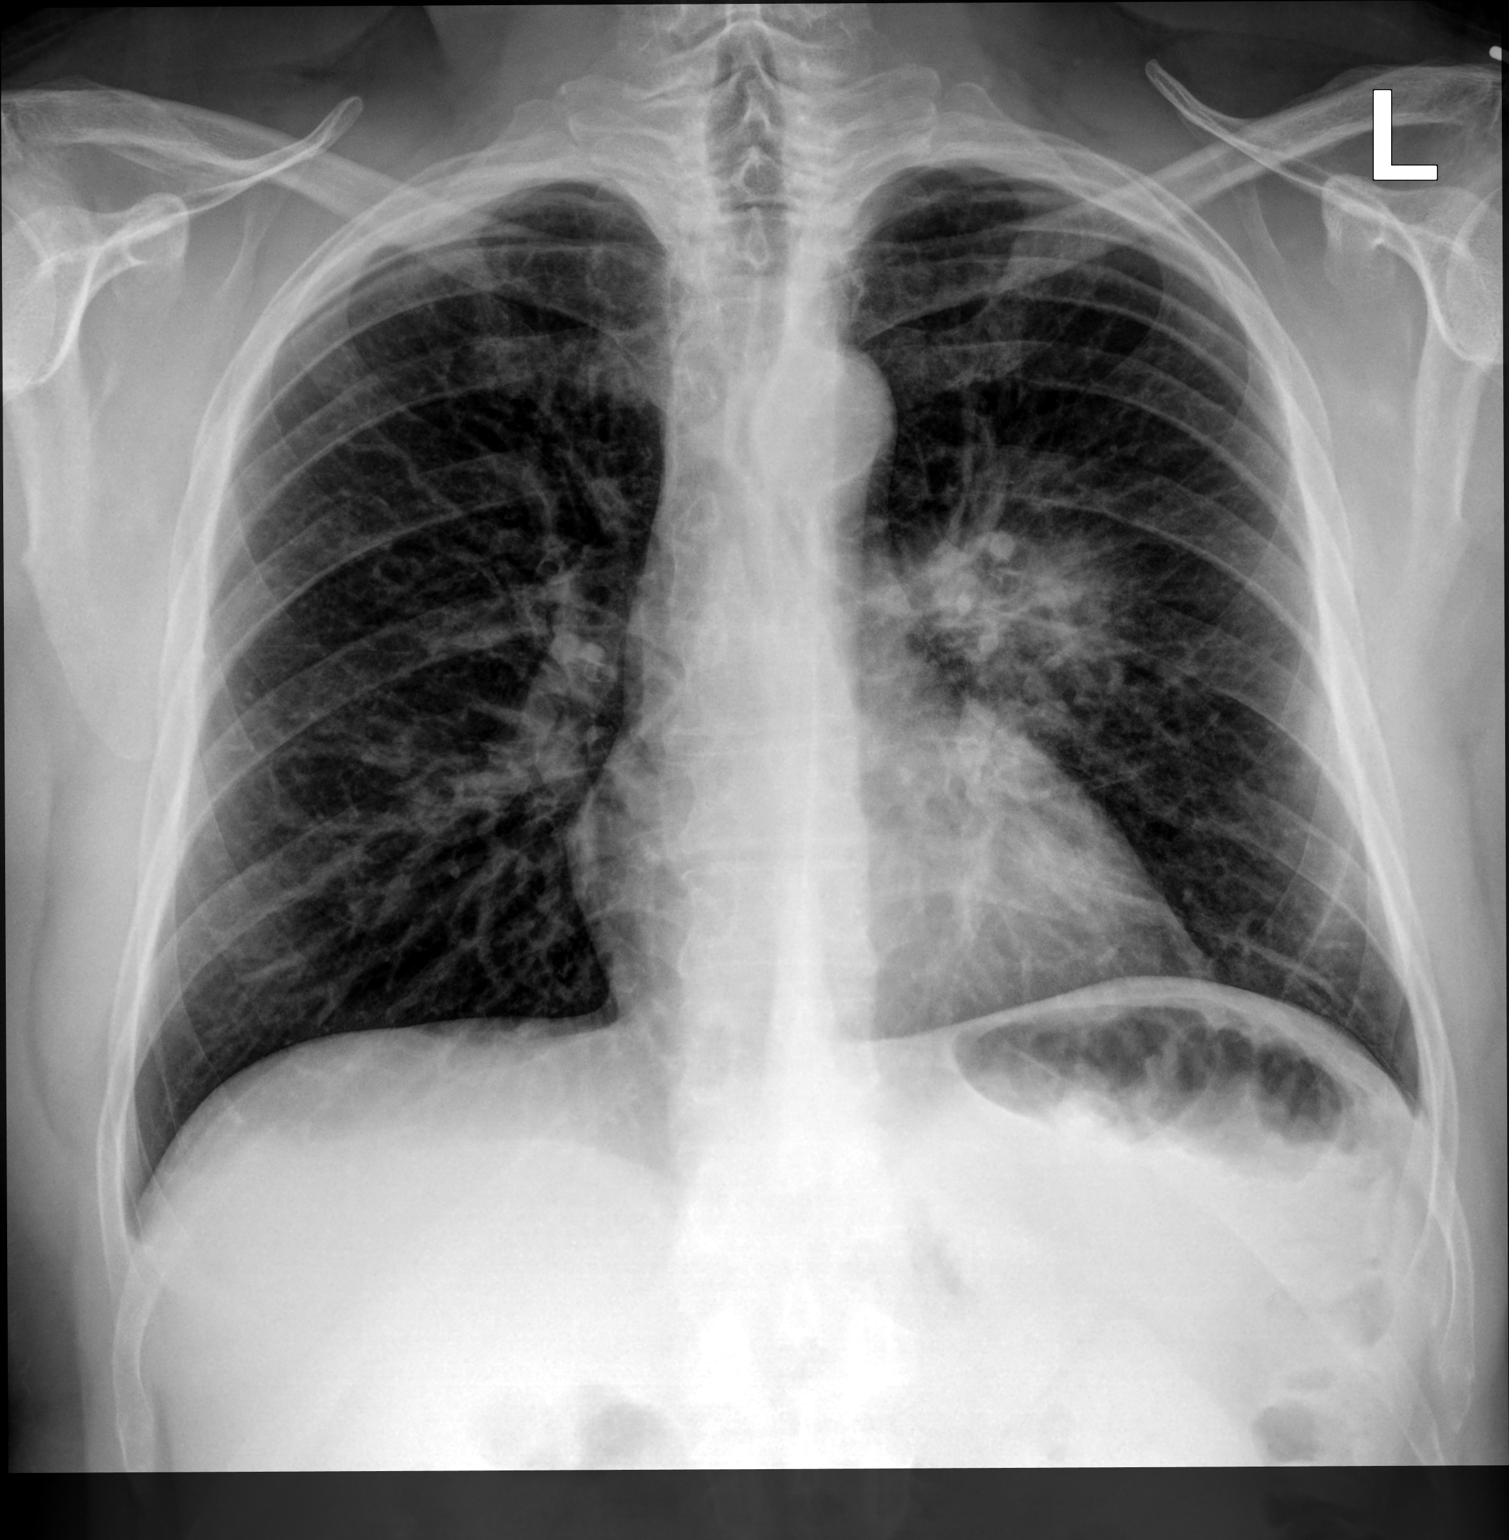

[chest lat]
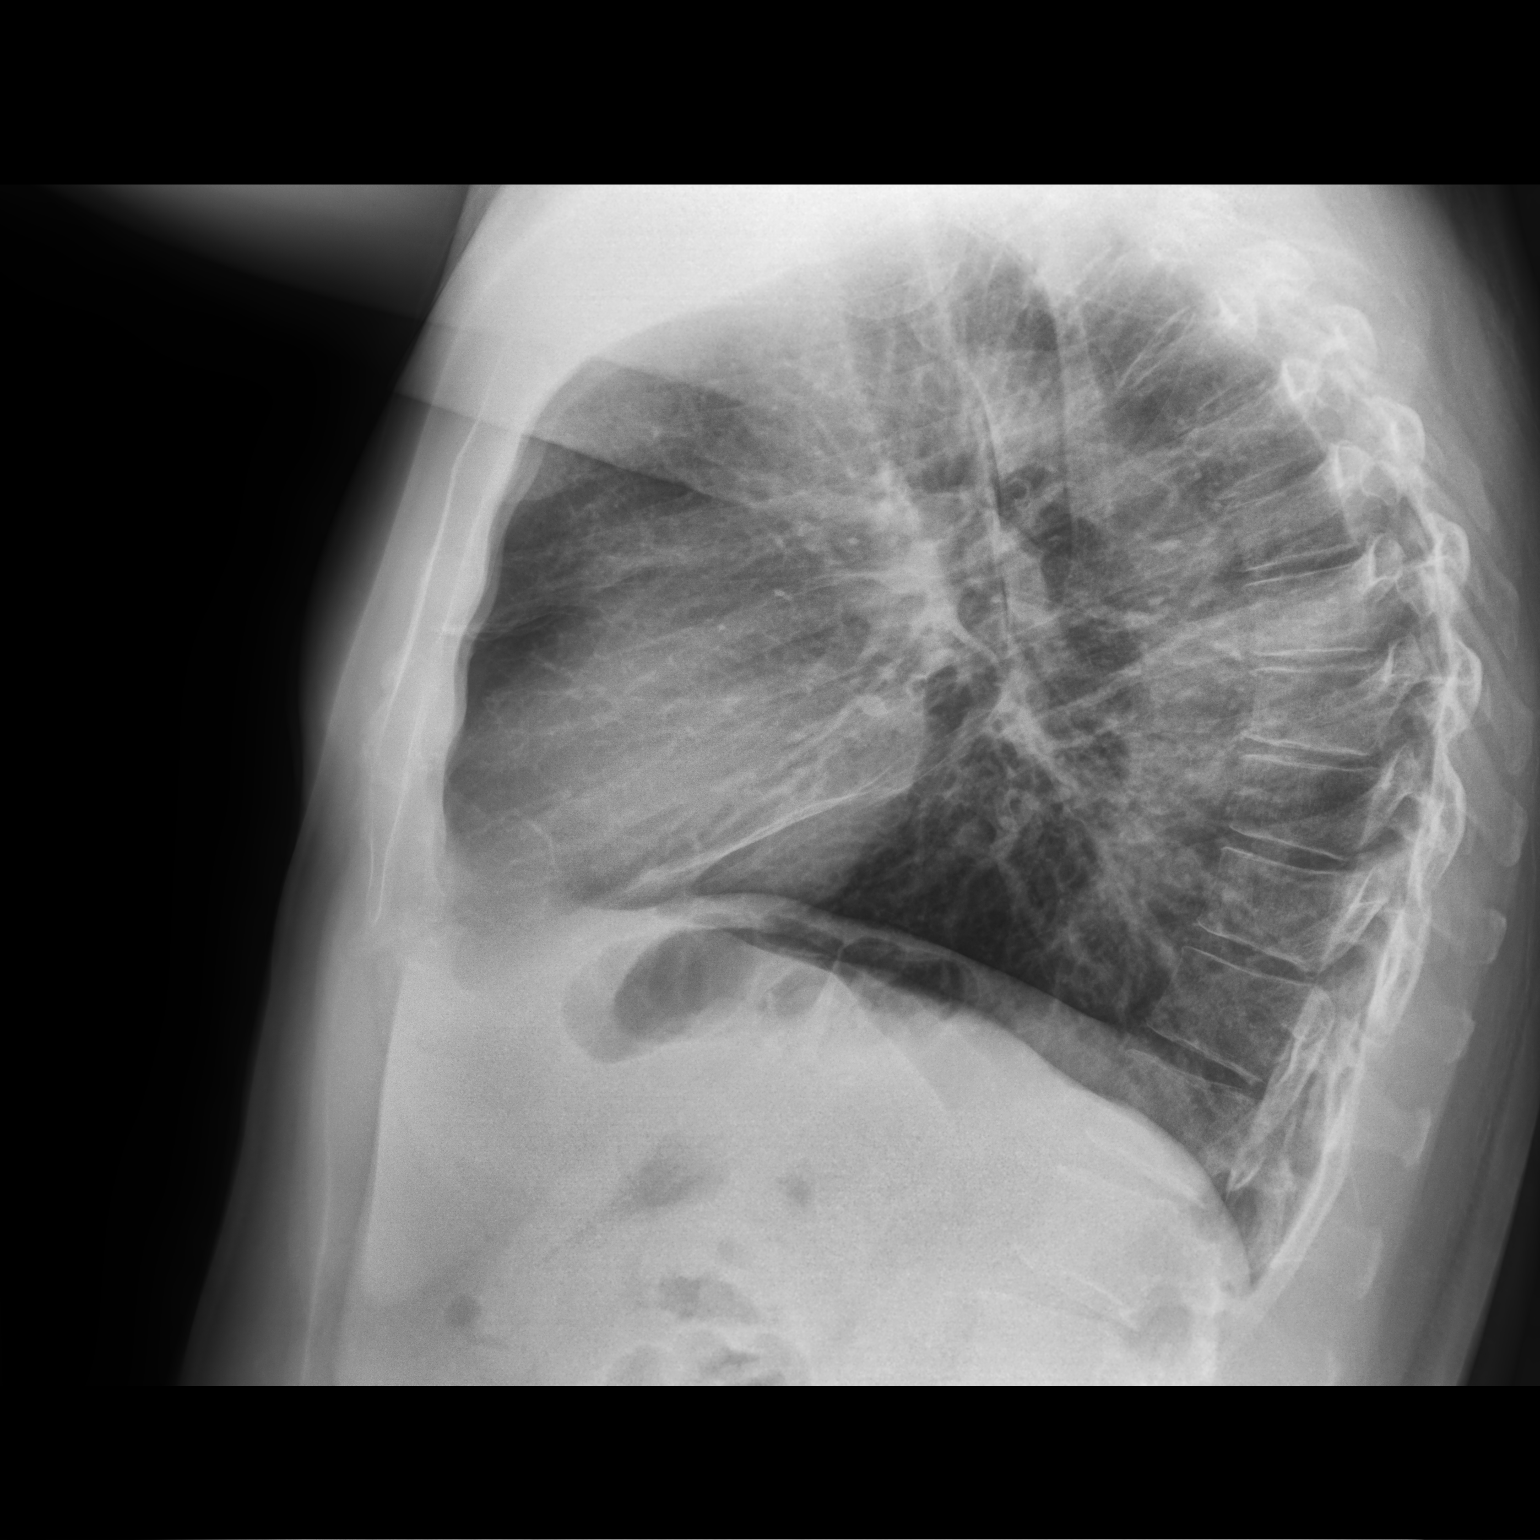

[2 of 2 positions shown; findings below may reference images not displayed]

FINDINGS: Focal poorly marginated airspace consolidation in the superior
segment left lower lobe, new since previous. Right lung clear.

Heart size and mediastinal contours are within normal limits.

No effusion.  No pneumothorax.

Visualized bones unremarkable.
IMPRESSION: 1. Superior segment left lower lobe airspace disease suggesting
pneumonia. Followup PA and lateral chest X-ray is recommended in 3-4
weeks following trial of antibiotic therapy to ensure resolution and
exclude underlying malignancy.
